# Patient Record
Sex: Male | Born: 1970 | Race: White | Hispanic: No | Marital: Married | Smoking: Never smoker
Health system: Southern US, Community
[De-identification: ages and names within clinical notes are randomized; demographics above are authoritative.]

## PROBLEM LIST (undated history)

## (undated) DIAGNOSIS — K760 Fatty (change of) liver, not elsewhere classified: Secondary | ICD-10-CM

## (undated) DIAGNOSIS — I10 Essential (primary) hypertension: Secondary | ICD-10-CM

## (undated) DIAGNOSIS — E785 Hyperlipidemia, unspecified: Secondary | ICD-10-CM

## (undated) HISTORY — PX: ESOPHAGOGASTRODUODENOSCOPY: SHX1529

## (undated) HISTORY — PX: TYMPANOSTOMY TUBE PLACEMENT: SHX32

## (undated) HISTORY — PX: KNEE SURGERY: SHX244

---

## 2013-07-06 ENCOUNTER — Emergency Department (HOSPITAL_BASED_OUTPATIENT_CLINIC_OR_DEPARTMENT_OTHER)
Admission: EM | Admit: 2013-07-06 | Discharge: 2013-07-06 | Disposition: A | Discharge status: Home / Self Care | Payer: BC Managed Care – PPO | Attending: Emergency Medicine | Admitting: Emergency Medicine

## 2013-07-06 ENCOUNTER — Emergency Department (HOSPITAL_BASED_OUTPATIENT_CLINIC_OR_DEPARTMENT_OTHER): Payer: BC Managed Care – PPO

## 2013-07-06 ENCOUNTER — Encounter (HOSPITAL_BASED_OUTPATIENT_CLINIC_OR_DEPARTMENT_OTHER): Payer: Self-pay | Admitting: Emergency Medicine

## 2013-07-06 DIAGNOSIS — I10 Essential (primary) hypertension: Secondary | ICD-10-CM | POA: Insufficient documentation

## 2013-07-06 DIAGNOSIS — E785 Hyperlipidemia, unspecified: Secondary | ICD-10-CM | POA: Insufficient documentation

## 2013-07-06 DIAGNOSIS — R109 Unspecified abdominal pain: Secondary | ICD-10-CM | POA: Insufficient documentation

## 2013-07-06 HISTORY — DX: Fatty (change of) liver, not elsewhere classified: K76.0

## 2013-07-06 HISTORY — DX: Hyperlipidemia, unspecified: E78.5

## 2013-07-06 HISTORY — DX: Essential (primary) hypertension: I10

## 2013-07-06 LAB — COMPREHENSIVE METABOLIC PANEL
ALT: 57 U/L — ABNORMAL HIGH (ref 0–53)
AST: 42 U/L — ABNORMAL HIGH (ref 0–37)
Albumin: 5 g/dL (ref 3.5–5.2)
Alkaline Phosphatase: 63 U/L (ref 39–117)
BILIRUBIN TOTAL: 0.7 mg/dL (ref 0.3–1.2)
BUN: 11 mg/dL (ref 6–23)
CO2: 28 mEq/L (ref 19–32)
Calcium: 10.2 mg/dL (ref 8.4–10.5)
Chloride: 98 mEq/L (ref 96–112)
Creatinine, Ser: 0.9 mg/dL (ref 0.50–1.35)
GFR calc Af Amer: >90 mL/min (ref >90)
GFR calc non Af Amer: >90 mL/min (ref >90)
GLUCOSE: 112 mg/dL — AB (ref 70–99)
Potassium: 4 mEq/L (ref 3.7–5.3)
Sodium: 141 mEq/L (ref 137–147)
TOTAL PROTEIN: 8.2 g/dL (ref 6.0–8.3)

## 2013-07-06 LAB — CBC WITH DIFFERENTIAL/PLATELET
BASOS PCT: 0 % (ref 0–1)
Basophils Absolute: 0 10*3/uL (ref 0.0–0.1)
EOS ABS: 0.1 10*3/uL (ref 0.0–0.7)
Eosinophils Relative: 2 % (ref 0–5)
HEMATOCRIT: 45.9 % (ref 39.0–52.0)
HEMOGLOBIN: 16.8 g/dL (ref 13.0–17.0)
LYMPHS ABS: 2 10*3/uL (ref 0.7–4.0)
Lymphocytes Relative: 27 % (ref 12–46)
MCH: 32.7 pg (ref 26.0–34.0)
MCHC: 36.6 g/dL — AB (ref 30.0–36.0)
MCV: 89.5 fL (ref 78.0–100.0)
MONO ABS: 0.8 10*3/uL (ref 0.1–1.0)
Monocytes Relative: 11 % (ref 3–12)
Neutro Abs: 4.4 10*3/uL (ref 1.7–7.7)
Neutrophils Relative %: 60 % (ref 43–77)
Platelets: 269 10*3/uL (ref 150–400)
RBC: 5.13 MIL/uL (ref 4.22–5.81)
RDW: 12.5 % (ref 11.5–15.5)
WBC: 7.4 10*3/uL (ref 4.0–10.5)

## 2013-07-06 LAB — LIPASE, BLOOD: LIPASE: 38 U/L (ref 11–59)

## 2013-07-06 LAB — URINALYSIS, ROUTINE W REFLEX MICROSCOPIC
BILIRUBIN URINE: NEGATIVE
Glucose, UA: NEGATIVE mg/dL
Hgb urine dipstick: NEGATIVE
KETONES UR: NEGATIVE mg/dL
Leukocytes, UA: NEGATIVE
NITRITE: NEGATIVE
Protein, ur: NEGATIVE mg/dL
SPECIFIC GRAVITY, URINE: 1.004 — AB (ref 1.005–1.030)
UROBILINOGEN UA: 0.2 mg/dL (ref 0.0–1.0)
pH: 7 (ref 5.0–8.0)

## 2013-07-06 MED ORDER — IOHEXOL 300 MG/ML  SOLN
50.0000 mL | Freq: Once | INTRAMUSCULAR | Status: AC | PRN
  Administered 2013-07-06: 50 mL via ORAL

## 2013-07-06 MED ORDER — IOHEXOL 300 MG/ML  SOLN
100.0000 mL | Freq: Once | INTRAMUSCULAR | Status: AC | PRN
  Administered 2013-07-06: 100 mL via INTRAVENOUS

## 2013-07-06 NOTE — ED Notes (Signed)
Patient transported to CT 

## 2013-07-06 NOTE — ED Notes (Addendum)
Abdominal pain and bloating since Saturday. Symptoms unrelieved after OTC Prilosec.

## 2013-07-06 NOTE — Discharge Instructions (Signed)
Magnesium citrate: Drink entire 10 ounce bottle mixed with equal parts Sprite or Gatorade.  Return to the emergency department for worsening pain, high fever, bloody stool, or if you experience any other new and concerning symptoms.   Abdominal Pain, Adult Many things can cause abdominal pain. Usually, abdominal pain is not caused by a disease and will improve without treatment. It can often be observed and treated at home. Your health care provider will do a physical exam and possibly order blood tests and X-rays to help determine the seriousness of your pain. However, in many cases, more time must pass before a clear cause of the pain can be found. Before that point, your health care provider may not know if you need more testing or further treatment. HOME CARE INSTRUCTIONS  Monitor your abdominal pain for any changes. The following actions may help to alleviate any discomfort you are experiencing:  Only take over-the-counter or prescription medicines as directed by your health care provider.  Do not take laxatives unless directed to do so by your health care provider.  Try a clear liquid diet (broth, tea, or water) as directed by your health care provider. Slowly move to a bland diet as tolerated. SEEK MEDICAL CARE IF:  You have unexplained abdominal pain.  You have abdominal pain associated with nausea or diarrhea.  You have pain when you urinate or have a bowel movement.  You experience abdominal pain that wakes you in the night.  You have abdominal pain that is worsened or improved by eating food.  You have abdominal pain that is worsened with eating fatty foods. SEEK IMMEDIATE MEDICAL CARE IF:   Your pain does not go away within 2 hours.  You have a fever.  You keep throwing up (vomiting).  Your pain is felt only in portions of the abdomen, such as the right side or the left lower portion of the abdomen.  You pass bloody or black tarry stools. MAKE SURE  YOU:  Understand these instructions.   Will watch your condition.   Will get help right away if you are not doing well or get worse.  Document Released: 11/20/2004 Document Revised: 12/01/2012 Document Reviewed: 10/20/2012 [facility] Patient Information 2014 [place], [place].

## 2013-07-06 NOTE — ED Provider Notes (Signed)
CSN: [id number]     Arrival date & time 07/06/13  1231 History   First MD Initiated Contact with Patient 07/06/13 1253     No chief complaint on file.    (Consider location/radiation/quality/duration/timing/severity/associated sxs/prior Treatment) HPI Comments: Patient is a 43 year old male with past medical history of hypertension. Presents today with complaints of abdominal distention and discomfort that has been worsening over the past 4 days. He denies any vomiting or diarrhea but does feel somewhat nauseated. He denies any bloody stool. He has not had anything like this before. He denies any prior abdominal surgeries.  Patient is a 43 y.o. male presenting with abdominal pain. The history is provided by the patient.  Abdominal Pain Pain location:  Generalized Pain quality: cramping   Pain radiates to:  Does not radiate Pain severity:  Moderate Onset quality:  Gradual Duration:  4 days Timing:  Constant Progression:  Worsening Chronicity:  New Relieved by:  Nothing Worsened by:  Nothing tried Ineffective treatments:  None tried   No past medical history on file. No past surgical history on file. No family history on file. History  Substance Use Topics  . Smoking status: Not on file  . Smokeless tobacco: Not on file  . Alcohol Use: Not on file    Review of Systems  Gastrointestinal: Positive for abdominal pain.  All other systems reviewed and are negative.     Allergies  Review of patient's allergies indicates not on file.  Home Medications   Prior to Admission medications   Not on File   Pulse 88  Temp(Src) 98.6 F (37 C) (Oral)  Resp 20  Ht 6\' 4"  (1.93 m)  Wt 310 lb (140.615 kg)  BMI 37.75 kg/m2  SpO2 98% Physical Exam  Nursing note and vitals reviewed. Constitutional: He is oriented to person, place, and time. He appears well-developed and well-nourished. No distress.  HENT:  Head: Normocephalic and atraumatic.  Mouth/Throat: Oropharynx is clear  and moist.  Neck: Normal range of motion. Neck supple.  Cardiovascular: Normal rate, regular rhythm and normal heart sounds.   No murmur heard. Pulmonary/Chest: Effort normal and breath sounds normal. No respiratory distress.  Abdominal: Soft. Bowel sounds are normal. He exhibits no distension and no mass. There is no tenderness. There is no rebound and no guarding.  Musculoskeletal: Normal range of motion. He exhibits no edema.  Lymphadenopathy:    He has no cervical adenopathy.  Neurological: He is alert and oriented to person, place, and time.  Skin: Skin is warm and dry. He is not diaphoretic.    ED Course  Procedures (including critical care time) Labs Review Labs Reviewed  CBC WITH DIFFERENTIAL  COMPREHENSIVE METABOLIC PANEL  LIPASE, BLOOD  URINALYSIS, ROUTINE W REFLEX MICROSCOPIC    Imaging Review No results found.   EKG Interpretation None      MDM   Final diagnoses:  None    Patient is a [age] year old male who presents with abdominal bloating and cramping. His physical examination is remarkable only for mild tenderness in the epigastrium. There is no fluid wave and no evidence for ascites. Laboratory studies are essentially unremarkable and CT scan shows only moderate stool within the colon. There is no evidence for appendicitis or other acute intra-abdominal pathology. At this point I feel as though he is appropriate for discharge. We'll recommend magnesium citrate and when necessary followup.    Geoffery Lyonsouglas Borna Wessinger, MD 07/06/13 [phone]

## 2013-07-06 NOTE — ED Notes (Signed)
Patient is resting comfortably. 

## 2015-04-11 ENCOUNTER — Encounter: Payer: Self-pay | Admitting: Cardiology

## 2015-04-18 ENCOUNTER — Ambulatory Visit (INDEPENDENT_AMBULATORY_CARE_PROVIDER_SITE_OTHER): Payer: BLUE CROSS/BLUE SHIELD | Admitting: Cardiology

## 2015-04-18 ENCOUNTER — Encounter: Payer: Self-pay | Admitting: Cardiology

## 2015-04-18 VITALS — BP 125/86 | HR 80 | Ht 76.0 in | Wt 317.1 lb

## 2015-04-18 DIAGNOSIS — R002 Palpitations: Secondary | ICD-10-CM | POA: Diagnosis not present

## 2015-04-18 DIAGNOSIS — I10 Essential (primary) hypertension: Secondary | ICD-10-CM | POA: Diagnosis not present

## 2015-04-18 DIAGNOSIS — R072 Precordial pain: Secondary | ICD-10-CM | POA: Diagnosis not present

## 2015-04-18 DIAGNOSIS — R079 Chest pain, unspecified: Secondary | ICD-10-CM | POA: Insufficient documentation

## 2015-04-18 NOTE — Progress Notes (Signed)
     HPI: 45 yo male for evaluation of CP. Stress pet [place] [facility] 10/15 EF 62 and normal perfusion. Patient exercises by walking. He denies dyspnea on exertion, orthopnea, PND, pedal edema, exertional chest pain or syncope. He has noticed that his heart rate will be elevated at times in the 110 range. He feels fatigue with this. He does not have sustained palpitations. He occasionally feels pain in the rib area bilaterally not related to exertion. Because of the above cardiology asked to evaluate.  Current Outpatient Prescriptions  Medication Sig Dispense Refill  . losartan (COZAAR) 100 MG tablet Take by mouth.    . Multiple Vitamin (MULTIVITAMIN) tablet Take 1 tablet by mouth daily.     No current facility-administered medications for this visit.    No Known Allergies   Past Medical History  Diagnosis Date  . Hypertension   . Fatty liver   . Hyperlipidemia     Past Surgical History  Procedure Laterality Date  . Knee surgery    . Tympanostomy tube placement    . Esophagogastroduodenoscopy      Social History   Social History  . Marital Status: Married    Spouse Name: N/A  . Number of Children: N/A  . Years of Education: N/A   Occupational History  .      Finance   Social History Main Topics  . Smoking status: Never Smoker   . Smokeless tobacco: Not on file  . Alcohol Use: 0.0 oz/week    0 Standard drinks or equivalent per week     Comment: occasional  . Drug Use: No  . Sexual Activity: Not on file   Other Topics Concern  . Not on file   Social History Narrative    Family History  Problem Relation Age of Onset  . Hypertension Father   . Diabetes Father   . Heart Problems Mother   . Breast cancer Mother   . Mitral valve prolapse Brother     ROS: no fevers or chills, productive cough, hemoptysis, dysphasia, odynophagia, melena, hematochezia, dysuria, hematuria, rash, seizure activity, orthopnea, PND, pedal edema, claudication. Remaining systems are  negative.  Physical Exam:   Blood pressure 125/86, pulse 80, height  (1.93 m), weight 317 lb 1.9 oz (143.845 kg).  General:  Well developed/well nourished in NAD Skin warm/dry Patient not depressed No peripheral clubbing Back-normal HEENT-normal/normal eyelids Neck supple/normal carotid upstroke bilaterally; no bruits; no JVD; no thyromegaly chest - CTA/ normal expansion CV - RRR/normal S1 and S2; no murmurs, rubs or gallops;  PMI nondisplaced Abdomen -NT/ND, no HSM, no mass, + bowel sounds, no bruit 2+ femoral pulses, no bruits Ext-no edema, chords, 2+ DP Neuro-grossly nonfocal  ECG Sinus rhythm at a rate of 78. IVCD.

## 2015-04-18 NOTE — Patient Instructions (Signed)
Medication Instructions:   NO CHANGE  Testing/Procedures:  Your physician has requested that you have an echocardiogram. Echocardiography is a painless test that uses sound waves to create images of your heart. It provides your doctor with information about the size and shape of your heart and how well your heart's chambers and valves are working. This procedure takes approximately one hour. There are no restrictions for this procedure.    Follow-Up:  Your physician recommends that you schedule a follow-up appointment in: AS NEEDED PENDING TEST RESULTS      

## 2015-04-18 NOTE — Assessment & Plan Note (Signed)
Patient feels his heart rate elevated at times. He does not have palpitations. We will arrange an echocardiogram to assess LV function particularly in light of family history of mitral valve prolapse. If symptoms worsen we could consider a monitor in the future. We will obtain most recent laboratories from primary care including TSH and hemoglobin.

## 2015-04-18 NOTE — Assessment & Plan Note (Signed)
Blood pressure controlled. Continue present medications. 

## 2015-04-18 NOTE — Assessment & Plan Note (Signed)
Symptoms atypical. Previous functional study negative. Electrocardiogram shows no ST changes. No further ischemia evaluation.

## 2015-04-20 ENCOUNTER — Ambulatory Visit: Payer: Self-pay | Admitting: Cardiology

## 2015-04-25 ENCOUNTER — Ambulatory Visit (HOSPITAL_BASED_OUTPATIENT_CLINIC_OR_DEPARTMENT_OTHER)
Admission: RE | Admit: 2015-04-25 | Discharge: 2015-04-25 | Disposition: A | Discharge status: Home / Self Care | Payer: BLUE CROSS/BLUE SHIELD | Source: Ambulatory Visit | Attending: Cardiology | Admitting: Cardiology

## 2015-04-25 DIAGNOSIS — R002 Palpitations: Secondary | ICD-10-CM | POA: Diagnosis present

## 2015-04-25 DIAGNOSIS — I119 Hypertensive heart disease without heart failure: Secondary | ICD-10-CM | POA: Diagnosis not present

## 2015-04-25 DIAGNOSIS — I071 Rheumatic tricuspid insufficiency: Secondary | ICD-10-CM | POA: Diagnosis not present

## 2015-04-25 NOTE — Progress Notes (Signed)
Echocardiogram 2D Echocardiogram has been performed.  Ian Wood 04/25/2015, 2:41 PM

## 2016-01-08 ENCOUNTER — Other Ambulatory Visit: Payer: Self-pay | Admitting: Family Medicine

## 2016-01-08 ENCOUNTER — Ambulatory Visit
Admission: RE | Admit: 2016-01-08 | Discharge: 2016-01-08 | Disposition: A | Discharge status: Home / Self Care | Payer: BLUE CROSS/BLUE SHIELD | Source: Ambulatory Visit | Attending: Family Medicine | Admitting: Family Medicine

## 2016-01-08 DIAGNOSIS — R51 Headache: Principal | ICD-10-CM

## 2016-01-08 DIAGNOSIS — R519 Headache, unspecified: Secondary | ICD-10-CM

## 2016-01-08 MED ORDER — GADOBENATE DIMEGLUMINE 529 MG/ML IV SOLN
20.0000 mL | Freq: Once | INTRAVENOUS | Status: AC | PRN
  Administered 2016-01-08: 20 mL via INTRAVENOUS

## 2017-04-06 NOTE — Progress Notes (Signed)
HPI: FU CP. Stress pet [facility] [facility] 10/15 EF 62 and normal perfusion.  Echocardiogram March 2017 showed normal LV function, mild left atrial enlargement and mild tricuspid regurgitation.  Nuclear study February 2018 [facility] [place] showed normal perfusion.  Ejection fraction 44% with mild global hypokinesis.  Since last seen March 2017 patient denies dyspnea on exertion.  He recently increased his Bystolic for blood pressure and this caused chest pain.  He decreased to 5 mg daily and his symptoms resolved.  He denies palpitations or syncope.  Current Outpatient Medications  Medication Sig Dispense Refill  . BYSTOLIC 5 MG tablet TK 1 T PO QD  3  . Cholecalciferol (VITAMIN D PO) Take 1 capsule by mouth daily.    . Multiple Vitamin (MULTIVITAMIN) tablet Take 1 tablet by mouth daily.     No current facility-administered medications for this visit.      Past Medical History:  Diagnosis Date  . Fatty liver   . Hyperlipidemia   . Hypertension     Past Surgical History:  Procedure Laterality Date  . ESOPHAGOGASTRODUODENOSCOPY    . KNEE SURGERY    . TYMPANOSTOMY TUBE PLACEMENT      Social History   Socioeconomic History  . Marital status: Married    Spouse name: Not on file  . Number of children: Not on file  . Years of education: Not on file  . Highest education level: Not on file  Social Needs  . Financial resource strain: Not on file  . Food insecurity - worry: Not on file  . Food insecurity - inability: Not on file  . Transportation needs - medical: Not on file  . Transportation needs - non-medical: Not on file  Occupational History    Comment: Finance  Tobacco Use  . Smoking status: Never Smoker  . Smokeless tobacco: Never Used  Substance and Sexual Activity  . Alcohol use: Yes    Alcohol/week: 0.0 oz    Comment: occasional  . Drug use: No  . Sexual activity: Not on file  Other Topics Concern  . Not on file  Social History Narrative  . Not on file     Family History  Problem Relation Age of Onset  . Heart Problems Mother   . Breast cancer Mother   . Hypertension Father   . Diabetes Father   . Mitral valve prolapse Brother     ROS: no fevers or chills, productive cough, hemoptysis, dysphasia, odynophagia, melena, hematochezia, dysuria, hematuria, rash, seizure activity, orthopnea, PND, pedal edema, claudication. Remaining systems are negative.  Physical Exam: Well-developed well-nourished in no acute distress.  Skin is warm and dry.  HEENT is normal.  Neck is supple.  Chest is clear to auscultation with normal expansion.  Cardiovascular exam is regular rate and rhythm.  Abdominal exam nontender or distended. No masses palpated. Extremities show no edema. neuro grossly intact  ECG-sinus rhythm at a rate of 77.  No ST changes.  Personally reviewed  A/P  1 palpitations-symptoms are reasonably well controlled.  LV function is normal.  2 hypertension-blood pressure is mildly elevated.  He recently increased his Bystolic but felt it may be causing chest pain.  This has resolved.  I will change his Bystolic to 5 mg in the morning to 2.5 in the evening.  We will follow blood pressure and adjust regimen as needed  3 chest pain-recent nuclear study is negative.  Note his ejection fraction was 44% which may be a gating problem.  Check echocardiogram to reassess LV function.  Olga MillersBrian Crenshaw, MD

## 2017-04-15 ENCOUNTER — Encounter: Payer: Self-pay | Admitting: Cardiology

## 2017-04-15 ENCOUNTER — Ambulatory Visit (INDEPENDENT_AMBULATORY_CARE_PROVIDER_SITE_OTHER): Payer: 59 | Admitting: Cardiology

## 2017-04-15 VITALS — BP 149/85 | HR 77 | Ht 76.0 in | Wt 272.0 lb

## 2017-04-15 DIAGNOSIS — R072 Precordial pain: Secondary | ICD-10-CM | POA: Diagnosis not present

## 2017-04-15 DIAGNOSIS — R002 Palpitations: Secondary | ICD-10-CM | POA: Diagnosis not present

## 2017-04-15 DIAGNOSIS — I10 Essential (primary) hypertension: Secondary | ICD-10-CM

## 2017-04-15 MED ORDER — BYSTOLIC 5 MG PO TABS: 5.0000 mg | ORAL_TABLET | Freq: Two times a day (BID) | ORAL | 3 refills | Status: AC

## 2017-04-15 NOTE — Patient Instructions (Signed)
Medication Instructions:  Your physician has recommended you make the following change in your medication:  1. Increase bystolic one tablet ( 5mg  ) am and one half tablet (2.5 mg ) pm   Labwork:. -None  Testing/Procedures: Your physician has requested that you have an echocardiogram. Echocardiography is a painless test that uses sound waves to create images of your heart. It provides your doctor with information about the size and shape of your heart and how well your heart's chambers and valves are working. This procedure takes approximately one hour. There are no restrictions for this procedure.    Follow-Up: Your physician wants you to follow-up in: 1 year with Dr. Jens Somrenshaw.   You will receive a reminder letter in the mail two months in advance. If you don't receive a letter, please call our office to schedule the follow-up appointment.   Any Other Special Instructions Will Be Listed Below (If Applicable).     If you need a refill on your cardiac medications before your next appointment, please call your pharmacy.

## 2017-04-16 ENCOUNTER — Ambulatory Visit (HOSPITAL_COMMUNITY)
Admission: RE | Admit: 2017-04-16 | Discharge: 2017-04-16 | Disposition: A | Discharge status: Home / Self Care | Payer: 59 | Source: Ambulatory Visit | Attending: Cardiology | Admitting: Cardiology

## 2017-04-16 DIAGNOSIS — R072 Precordial pain: Secondary | ICD-10-CM

## 2017-04-16 DIAGNOSIS — I10 Essential (primary) hypertension: Secondary | ICD-10-CM | POA: Diagnosis not present

## 2017-04-16 DIAGNOSIS — E785 Hyperlipidemia, unspecified: Secondary | ICD-10-CM | POA: Diagnosis not present

## 2017-04-16 NOTE — Progress Notes (Signed)
  Echocardiogram 2D Echocardiogram has been performed.  Ian SkeenVijay  [name] 04/16/2017, 10:28 AM

## 2017-09-22 ENCOUNTER — Encounter: Payer: Self-pay | Admitting: Cardiology

## 2018-04-07 NOTE — Progress Notes (Deleted)
HPI: FU CP. Stress pet [facility] [facility] 10/15 EF 62 and normal perfusion. Nuclear study February 2018 [facility] [place] showed normal perfusion.  Ejection fraction 44% with mild global hypokinesis.  Echocardiogram February 2019 showed normal LV function and mild diastolic dysfunction. Since last seen   Current Outpatient Medications  Medication Sig Dispense Refill  . BYSTOLIC 5 MG tablet Take 1 tablet (5 mg total) by mouth 2 (two) times daily. One tablet ( 5mg ) am half tablet (2.5 mg ) pm 30 tablet 3  . Cholecalciferol (VITAMIN D PO) Take 1 capsule by mouth daily.    . Multiple Vitamin (MULTIVITAMIN) tablet Take 1 tablet by mouth daily.     No current facility-administered medications for this visit.      Past Medical History:  Diagnosis Date  . Fatty liver   . Hyperlipidemia   . Hypertension     Past Surgical History:  Procedure Laterality Date  . ESOPHAGOGASTRODUODENOSCOPY    . KNEE SURGERY    . TYMPANOSTOMY TUBE PLACEMENT      Social History   Socioeconomic History  . Marital status: Married    Spouse name: Not on file  . Number of children: Not on file  . Years of education: Not on file  . Highest education level: Not on file  Occupational History    Comment: Finance  Social Needs  . Financial resource strain: Not on file  . Food insecurity:    Worry: Not on file    Inability: Not on file  . Transportation needs:    Medical: Not on file    Non-medical: Not on file  Tobacco Use  . Smoking status: Never Smoker  . Smokeless tobacco: Never Used  Substance and Sexual Activity  . Alcohol use: Yes    Alcohol/week: 0.0 standard drinks    Comment: occasional  . Drug use: No  . Sexual activity: Not on file  Lifestyle  . Physical activity:    Days per week: Not on file    Minutes per session: Not on file  . Stress: Not on file  Relationships  . Social connections:    Talks on phone: Not on file    Gets together: Not on file    Attends religious service: Not  on file    Active member of club or organization: Not on file    Attends meetings of clubs or organizations: Not on file    Relationship status: Not on file  . Intimate partner violence:    Fear of current or ex partner: Not on file    Emotionally abused: Not on file    Physically abused: Not on file    Forced sexual activity: Not on file  Other Topics Concern  . Not on file  Social History Narrative  . Not on file    Family History  Problem Relation Age of Onset  . Heart Problems Mother   . Breast cancer Mother   . Hypertension Father   . Diabetes Father   . Mitral valve prolapse Brother     ROS: no fevers or chills, productive cough, hemoptysis, dysphasia, odynophagia, melena, hematochezia, dysuria, hematuria, rash, seizure activity, orthopnea, PND, pedal edema, claudication. Remaining systems are negative.  Physical Exam: Well-developed well-nourished in no acute distress.  Skin is warm and dry.  HEENT is normal.  Neck is supple.  Chest is clear to auscultation with normal expansion.  Cardiovascular exam is regular rate and rhythm.  Abdominal exam nontender or distended.  No masses palpated. Extremities show no edema. neuro grossly intact  ECG- personally reviewed  A/P  1  Olga Millers, MD

## 2018-04-21 ENCOUNTER — Ambulatory Visit: Payer: 59 | Admitting: Cardiology

## 2018-07-01 IMAGING — MR MR HEAD WO/W CM
13 series · 48 of 48 positions shown · IV contrast (multihance)
Comparison: None.

CLINICAL DATA: Three months of posterior headaches and episodes of
dizziness.

Creatinine was obtained on site at [HOSPITAL] at [HOSPITAL].
Results: Creatinine 0.9 mg/dL.
EXAM:
MRI HEAD WITHOUT AND WITH CONTRAST
TECHNIQUE: Multiplanar, multiecho pulse sequences of the brain and surrounding
structures were obtained without and with intravenous contrast.
CONTRAST:  20mL MULTIHANCE GADOBENATE DIMEGLUMINE 529 MG/ML IV SOLN

[Series 5: T1 · sagittal · 4.0mm · 0.81mm/px · 1 of 30 slices shown (1 of 3)]
[im 1/30]
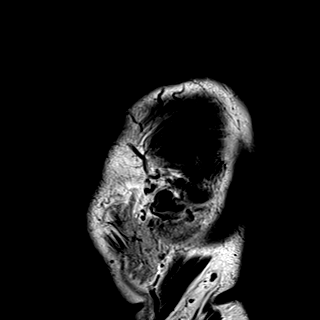

[Series 6: DWI · axial · 3.0mm · 1.50mm/px · z∈[-93,+69]mm · 4 of 100 slices shown (1 of 4)]
[im 1/100]
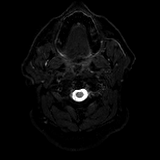
[im 34/100]
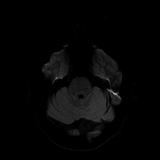
[im 67/100]
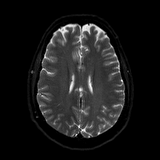
[im 100/100]
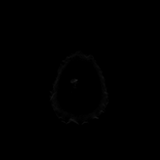

[Series 7: DWI · axial · 3.0mm · 1.50mm/px · z∈[-93,+69]mm · 2 of 50 slices shown (2 of 4)]
[im 1/50]
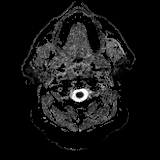
[im 50/50]
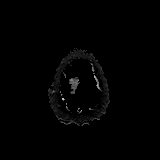

[Series 8: DWI · coronal · 5.0mm · 1.44mm/px · 4 of 70 slices shown (3 of 4)]
[im 1/70]
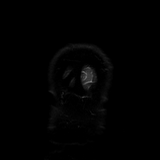
[im 24/70]
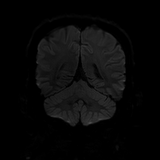
[im 47/70]
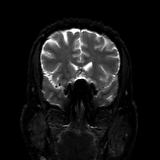
[im 70/70]
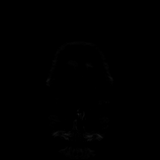

[Series 9: DWI · coronal · 5.0mm · 1.44mm/px · 2 of 35 slices shown (4 of 4)]
[im 1/35]
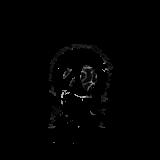
[im 35/35]
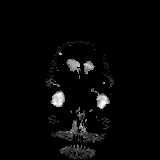

[Series 10: T2 · axial · 4.0mm · 0.38mm/px · z∈[-100,+66]mm · 2 of 33 slices shown]
[im 1/33]
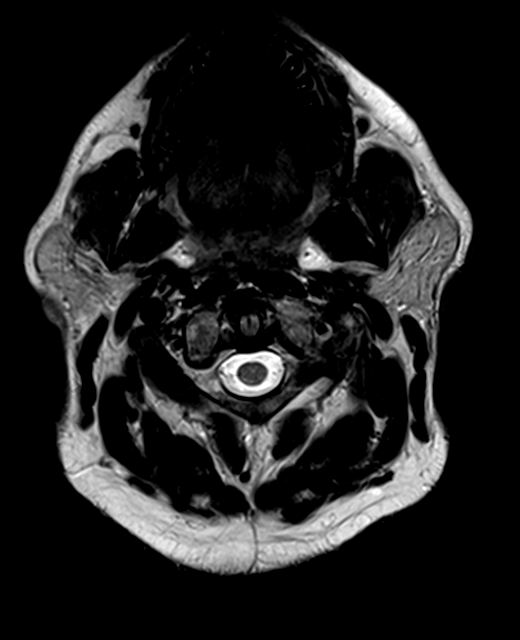
[im 33/33]
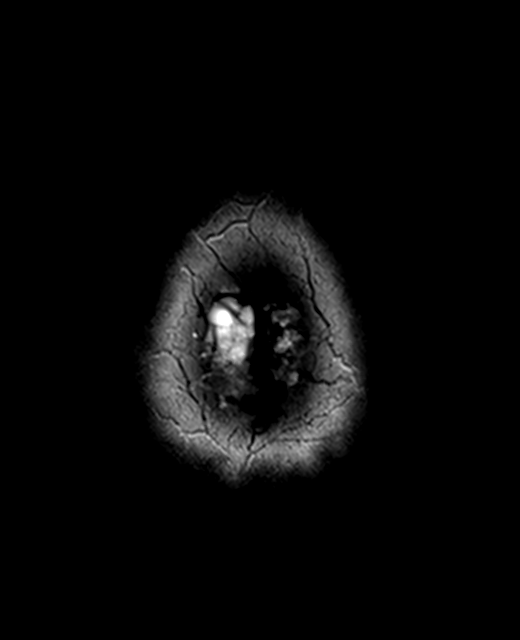

[Series 11: FLAIR · axial · 4.0mm · 0.75mm/px · z∈[-101,+65]mm · 2 of 33 slices shown]
[im 1/33]
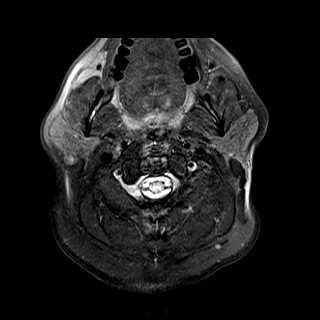
[im 33/33]
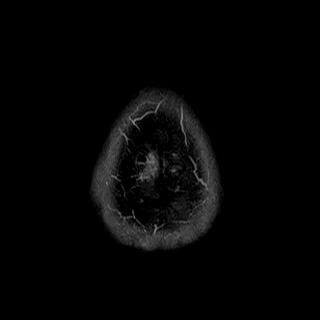

[Series 12: mip_images(sw) · axial · 12.0mm · 0.94mm/px · z∈[-95,+61]mm · 5 of 105 slices shown]
[im 1/105]
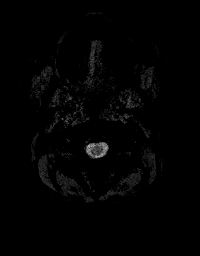
[im 27/105]
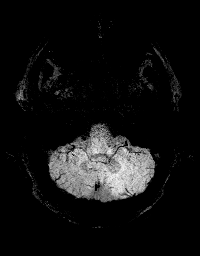
[im 53/105]
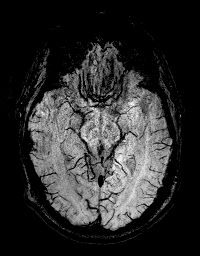
[im 79/105]
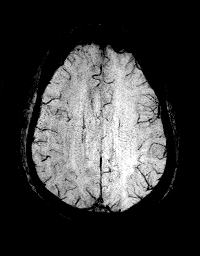
[im 105/105]
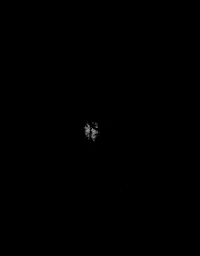

[Series 13: swi_images · axial · 1.5mm · 0.94mm/px · z∈[-100,+66]mm · 6 of 112 slices shown]
[im 1/112]
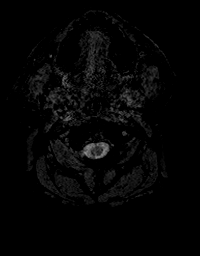
[im 23/112]
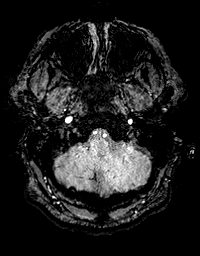
[im 45/112]
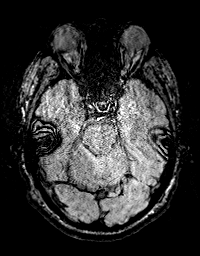
[im 67/112]
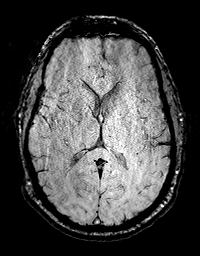
[im 89/112]
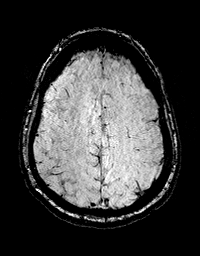
[im 112/112]
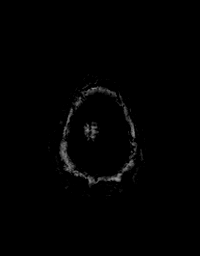

[Series 14: T1 · axial · 1.0mm · 0.94mm/px · z∈[-93,+66]mm · 8 of 160 slices shown (2 of 3)]
[im 1/160]
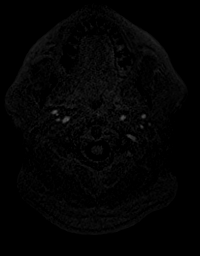
[im 23/160]
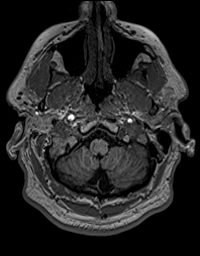
[im 46/160]
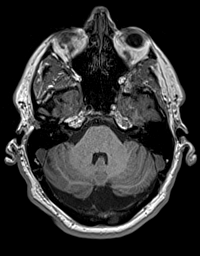
[im 69/160]
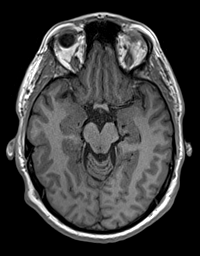
[im 91/160]
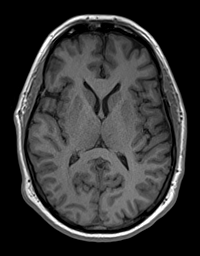
[im 114/160]
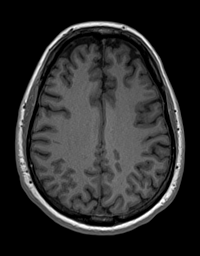
[im 137/160]
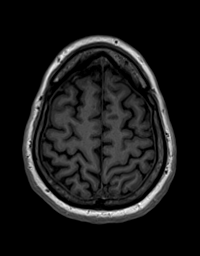
[im 160/160]
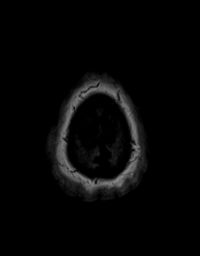

[Series 15: T2 post-contrast · coronal · 4.5mm · 0.36mm/px · 2 of 33 slices shown]
[im 1/33]
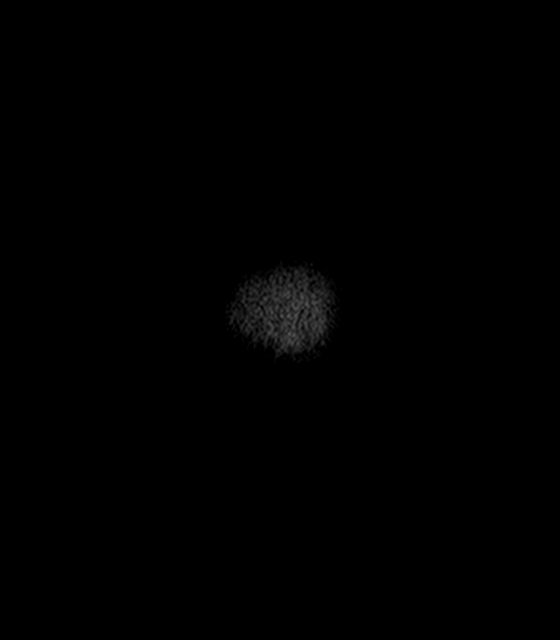
[im 33/33]
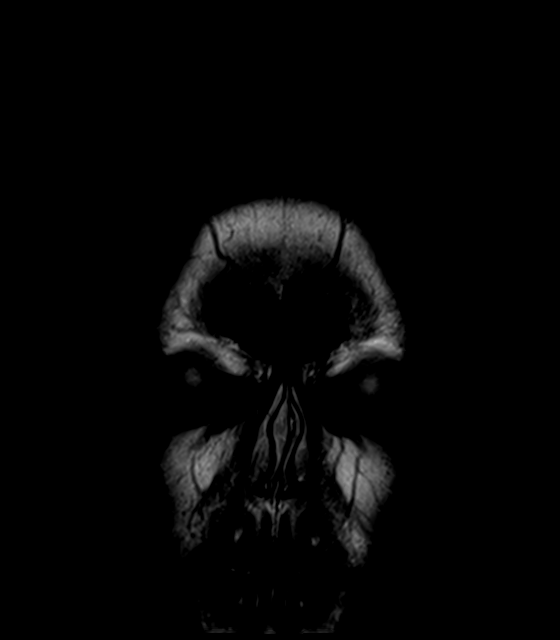

[Series 16: T1 · axial · 1.0mm · 0.94mm/px · z∈[-93,+66]mm · 8 of 160 slices shown (3 of 3)]
[im 1/160]
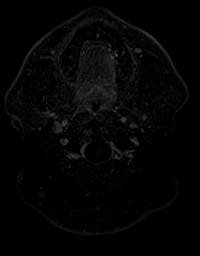
[im 23/160]
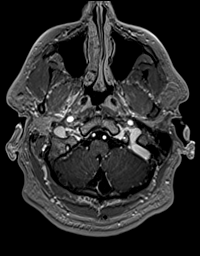
[im 46/160]
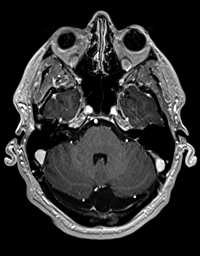
[im 69/160]
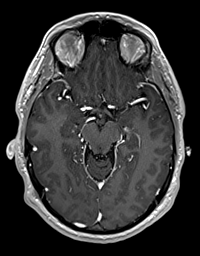
[im 91/160]
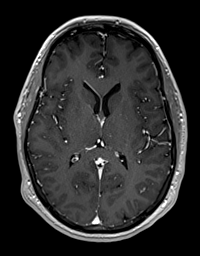
[im 114/160]
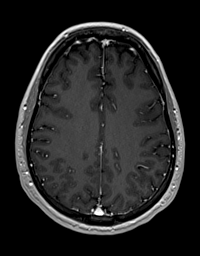
[im 137/160]
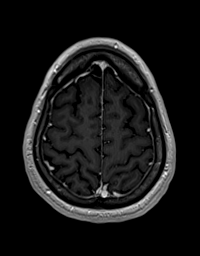
[im 160/160]
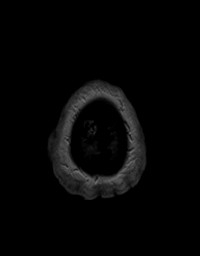

[Series 17: T1 post-contrast · coronal · 4.5mm · 0.72mm/px · 2 of 33 slices shown]
[im 1/33]
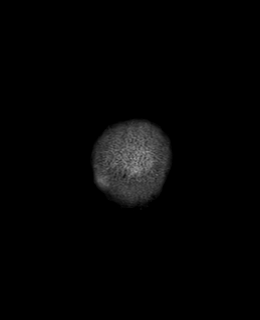
[im 33/33]
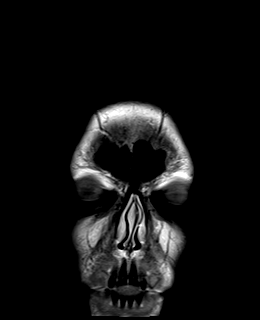

[48 of 48 positions shown; findings below may reference images not displayed]

FINDINGS: Brain: Incidental note is made of a mega cisterna magna, a normal
variant. There is no evidence of acute infarct, intracranial
hemorrhage, mass, midline shift, or extra-axial fluid collection.
The ventricles and sulci are normal. The brain is normal in signal.
No abnormal enhancement is identified. Limited assessment of the
seventh and eighth cranial nerve complexes on this nondedicated
study is unremarkable.

Vascular: Major intracranial vascular flow voids are preserved.

Skull and upper cervical spine: Unremarkable bone marrow signal.

Sinuses/Orbits: Unremarkable orbits. Mild mucosal thickening in the
maxillary sinuses with small retention cyst on the right. Clear
mastoid air cells.

Other: None.
IMPRESSION: Unremarkable appearance of the brain. No cause of vertigo or
headaches identified.
